# Patient Record
Sex: Male | Born: 1970 | Race: White | Hispanic: No | Marital: Married | Smoking: Current every day smoker
Health system: Southern US, Community
[De-identification: ages and names within clinical notes are randomized; demographics above are authoritative.]

---

## 2000-03-22 ENCOUNTER — Inpatient Hospital Stay (HOSPITAL_COMMUNITY): Admission: EM | Admit: 2000-03-22 | Discharge: 2000-03-26 | Payer: Self-pay | Admitting: *Deleted

## 2000-03-22 ENCOUNTER — Encounter: Payer: Self-pay | Admitting: Emergency Medicine

## 2000-03-23 ENCOUNTER — Encounter: Payer: Self-pay | Admitting: Internal Medicine

## 2000-03-25 ENCOUNTER — Encounter: Payer: Self-pay | Admitting: Gastroenterology

## 2003-11-29 ENCOUNTER — Inpatient Hospital Stay (HOSPITAL_BASED_OUTPATIENT_CLINIC_OR_DEPARTMENT_OTHER): Admission: RE | Admit: 2003-11-29 | Discharge: 2003-11-29 | Payer: Self-pay | Admitting: Cardiology

## 2006-06-03 ENCOUNTER — Ambulatory Visit (HOSPITAL_COMMUNITY): Admission: RE | Admit: 2006-06-03 | Discharge: 2006-06-04 | Payer: Self-pay | Admitting: Neurosurgery

## 2007-03-27 ENCOUNTER — Inpatient Hospital Stay (HOSPITAL_COMMUNITY): Admission: RE | Admit: 2007-03-27 | Discharge: 2007-03-30 | Payer: Self-pay | Admitting: Neurosurgery

## 2007-10-02 ENCOUNTER — Emergency Department (HOSPITAL_COMMUNITY): Admission: EM | Admit: 2007-10-02 | Discharge: 2007-10-02 | Payer: Self-pay | Admitting: Emergency Medicine

## 2007-11-04 ENCOUNTER — Ambulatory Visit (HOSPITAL_COMMUNITY): Admission: RE | Admit: 2007-11-04 | Discharge: 2007-11-04 | Payer: Self-pay | Admitting: Neurosurgery

## 2008-09-30 IMAGING — CR DG LUMBAR SPINE 2-3V
1 series · 1 of 1 positions shown · non-contrast
Comparison: none

CLINICAL DATA: 36-year-old with lumbar HNP.  Myelopathy.  Posterior fusion L5-S1. 
 LUMBAR SPINE ? 2 VIEW:

[view not recorded]
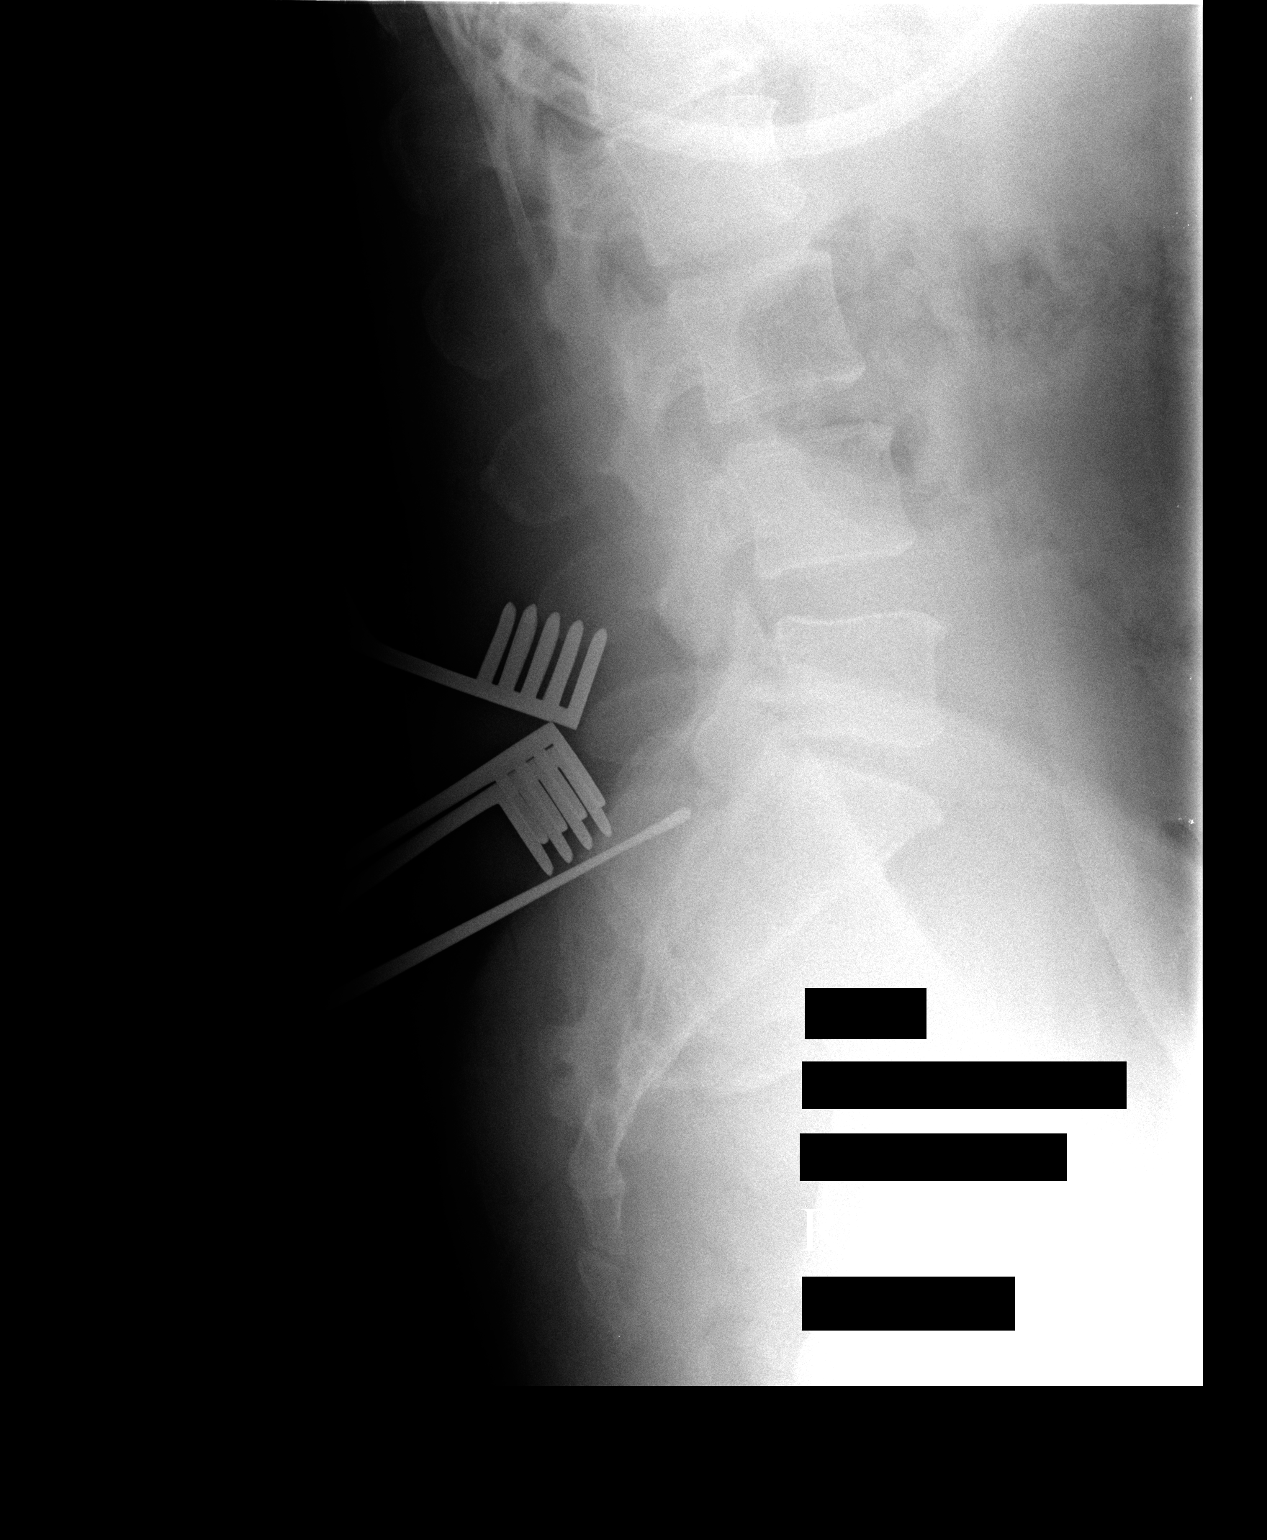

[1 of 1 positions shown; findings below may reference images not displayed]

FINDINGS: Exam at [DATE] hours shows posterior retractor in place and a surgical instrument overlying the posterior elements of the upper sacrum.  Film at [DATE] hours shows posterior retractor in place and a surgical instrument overlying the posterior elements posterior to the L5-S1 level.
IMPRESSION: Intraoperative localization.

## 2008-11-11 ENCOUNTER — Encounter: Admission: RE | Admit: 2008-11-11 | Discharge: 2008-11-11 | Payer: Self-pay | Admitting: Neurosurgery

## 2009-01-14 ENCOUNTER — Encounter
Admission: RE | Admit: 2009-01-14 | Discharge: 2009-01-20 | Payer: Self-pay | Admitting: Physical Medicine & Rehabilitation

## 2009-01-18 ENCOUNTER — Ambulatory Visit: Payer: Self-pay | Admitting: Physical Medicine & Rehabilitation

## 2009-01-26 ENCOUNTER — Emergency Department (HOSPITAL_COMMUNITY): Admission: EM | Admit: 2009-01-26 | Discharge: 2009-01-26 | Payer: Self-pay | Admitting: Emergency Medicine

## 2009-04-03 ENCOUNTER — Ambulatory Visit: Admission: RE | Admit: 2009-04-03 | Discharge: 2009-04-03 | Payer: Self-pay | Admitting: Neurology

## 2009-05-10 IMAGING — RF DG MYELOGRAM LUMBAR
11 series · 11 of 11 positions shown · non-contrast
Comparison: No comparison postsurgical myelogram or CT.
Intraoperative C-arm views 03/27/2007.

CLINICAL DATA: Low back pain.

MYELOGRAM LUMBAR
TECHNIQUE: Contrast was instilled by Dr. Dr. Gineth at the L3-4
level..
TECHNIQUE: CT imaging of the lumbar spine was performed after
intrathecal contrast administration.  Multiplanar CT image
reconstructions were also generated.

[Series 1: run · 1 of 1 slices shown (1 of 11)]
[im 1/1]
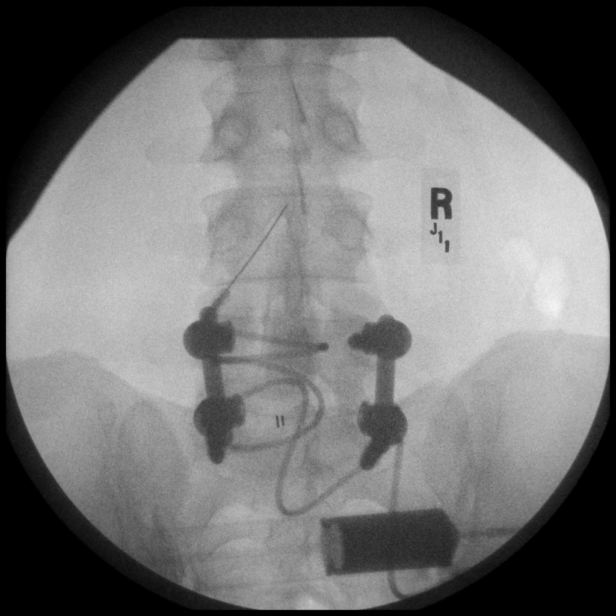

[Series 2: run · 1 of 1 slices shown (2 of 11)]
[im 1/1]
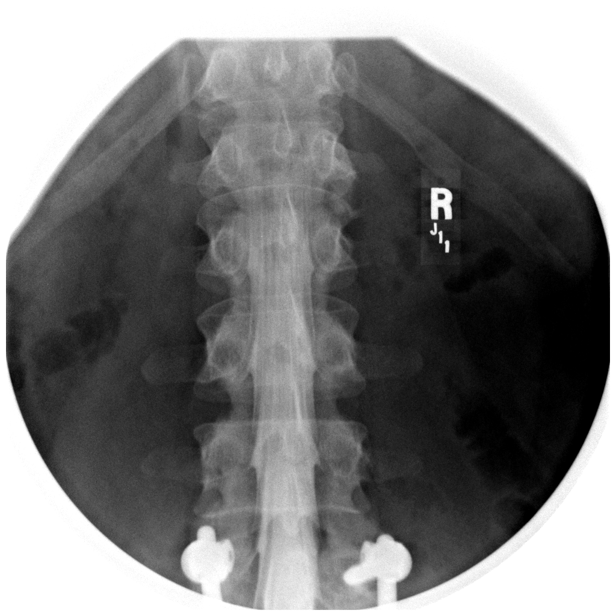

[Series 3: run · 1 of 1 slices shown (3 of 11)]
[im 1/1]
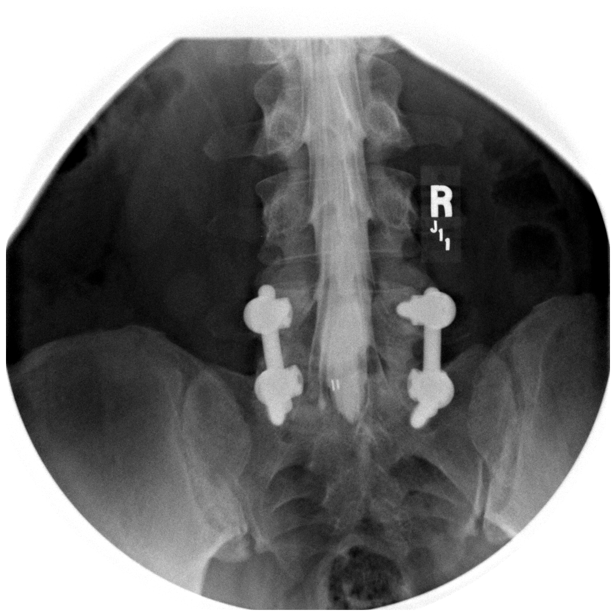

[Series 4: run · 1 of 1 slices shown (4 of 11)]
[im 1/1]
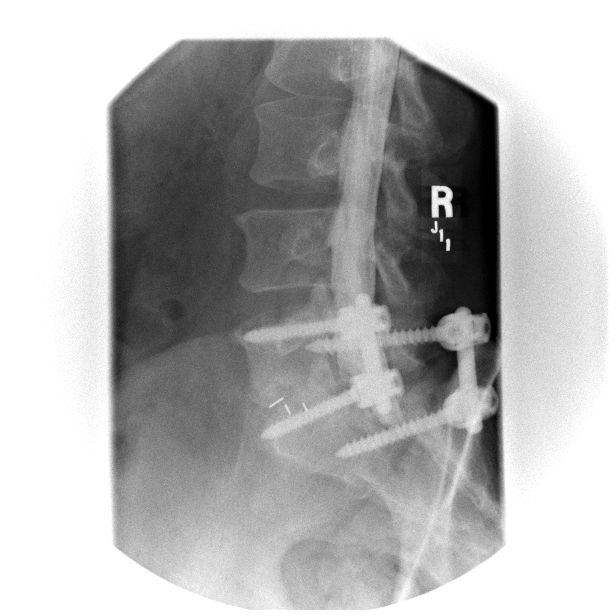

[Series 5: run · 1 of 1 slices shown (5 of 11)]
[im 1/1]
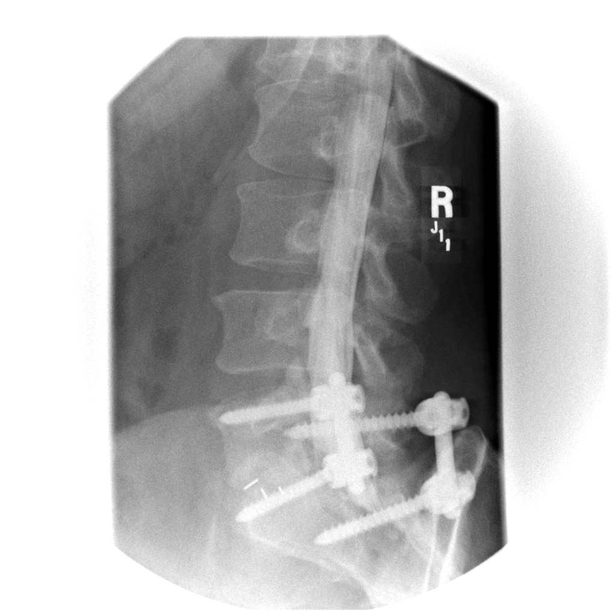

[Series 6: run · 1 of 1 slices shown (6 of 11)]
[im 1/1]
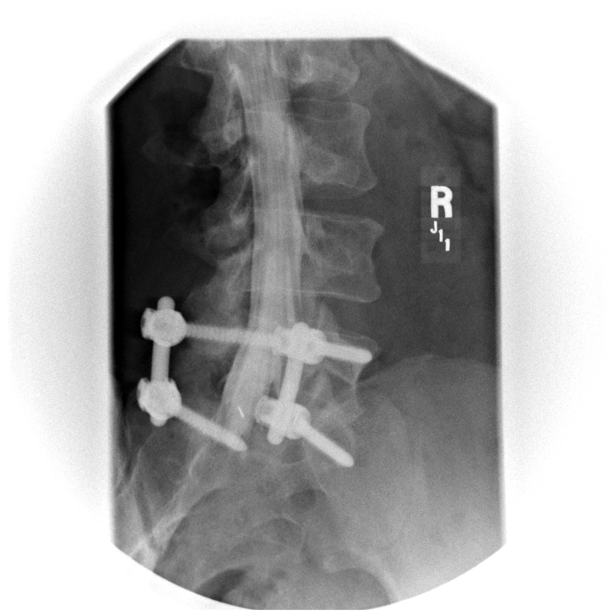

[Series 7: run · 1 of 1 slices shown (7 of 11)]
[im 1/1]
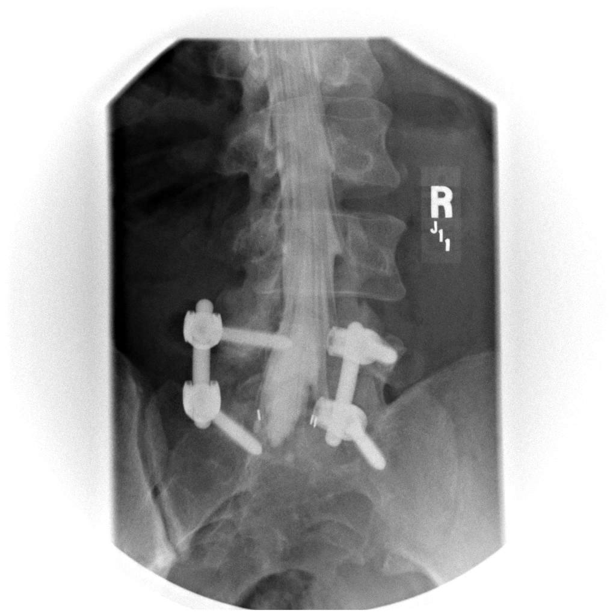

[Series 8: run · 1 of 1 slices shown (8 of 11)]
[im 1/1]
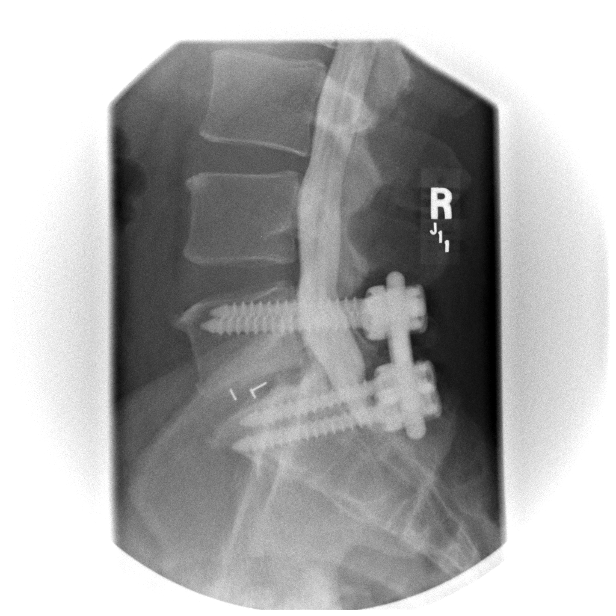

[Series 9: run · 1 of 1 slices shown (9 of 11)]
[im 1/1]
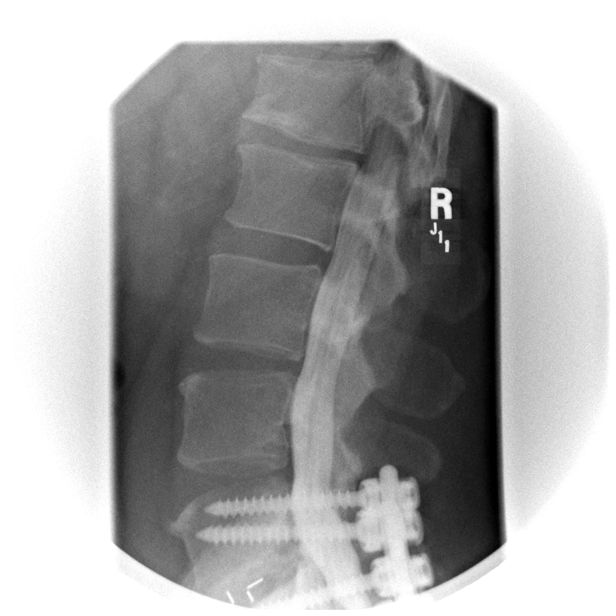

[Series 10: run · 1 of 1 slices shown (10 of 11)]
[im 1/1]
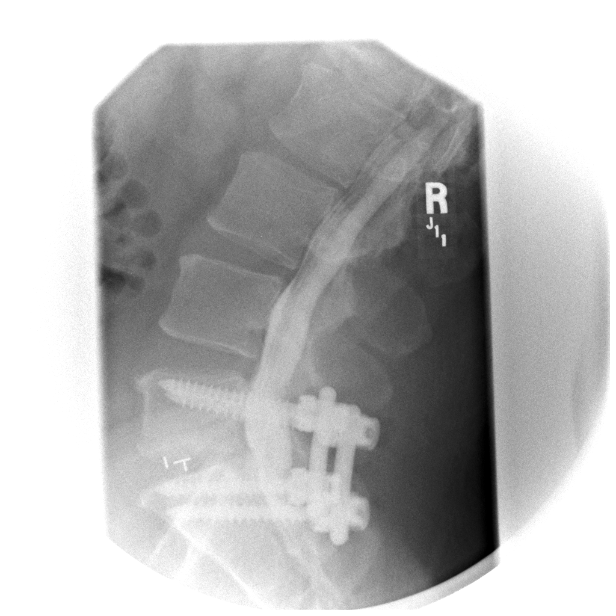

[Series 11: run · 1 of 1 slices shown (11 of 11)]
[im 1/1]
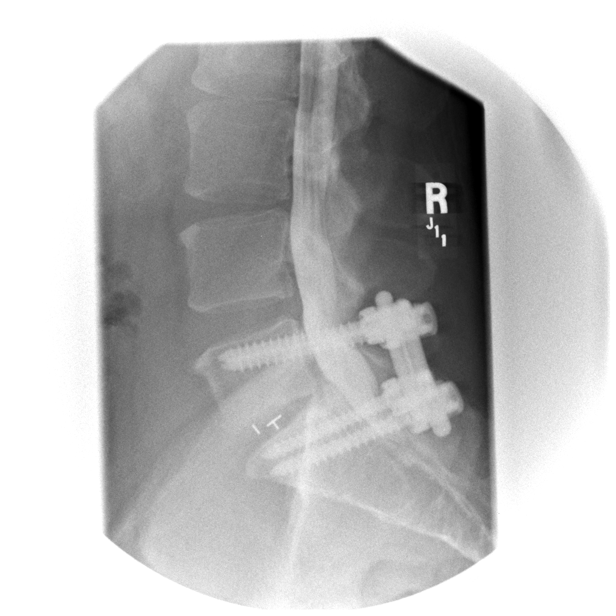

[11 of 11 positions shown; findings below may reference images not displayed]

FINDINGS: Status post fusion L5-S1 level.  No specific nerve root
compression.  Mild ventral impression upon the thecal sac at the L1-
2 and level.  No abnormal motion between flexion and extension.
IMPRESSION: Status post fusion L5-S1.

Mild ventral impression on the thecal sac L1-2 level.

CT MYELOGRAPHY LUMBAR SPINE
FINDINGS: Present examination incorporates from T12-L1 through the
upper sacrum. Conus mid L1 level.

T12-L1:  No significant spinal stenosis.

L1-2: Moderate Schmorl's node deformity.  Mild to moderate bulge.
Mild spinal stenosis.

L2-3: No significant spinal stenosis or foraminal narrowing.

L3-4: No significant spinal stenosis or foraminal narrowing.

L4-5: Minimal bulge.  Mild bilateral facet joint degenerative
changes.  No significant spinal stenosis or foraminal narrowing.

L5-S1: Prior fusion.  Bilateral pedicle screws and posterior bar in
place.  Interbody fusion device in place without well defined bony
fusion.  Broad-based disc osteophyte complex. Centrally at this
does not cause significant thecal sac or nerve root compromise..
This contributes to bilateral foraminal narrowing, left greater
right without significant nerve root compression..  Status post
left hemilaminectomy.  Postoperative granulation tissue surrounds
the proximal left S1 nerve root but does not cause significant
narrowing of such.  This postop granulation tissue causes minimal
flattening of the left lateral ventral aspect of thecal sac just
above the takeoff of the left S2 nerve root.
IMPRESSION: Status post fusion L5-S1 level without significant bony
incorporation across the interbody fusion device.  Disc osteophyte
complex contributes to bilateral foraminal narrowing without
significant compression of the exiting L5 nerve roots.
Postoperative granulation tissue surrounds the left S1 nerve root
(without significant compression of such) with minimal flattening
of the adjacent thecal sac.

L1-2 mild to  moderate bulge with mild spinal stenosis.

## 2009-05-10 IMAGING — CT CT L SPINE W/ CM
4 of 11 series · 9 of 33 positions shown, 10 images · non-contrast
Comparison: No comparison postsurgical myelogram or CT.
Intraoperative C-arm views 03/27/2007.

CLINICAL DATA: Low back pain.

MYELOGRAM LUMBAR
TECHNIQUE: Contrast was instilled by Dr. Dr. Gineth at the L3-4
level..
TECHNIQUE: CT imaging of the lumbar spine was performed after
intrathecal contrast administration.  Multiplanar CT image
reconstructions were also generated.

[Series 2: l-spine helical · axial · 0.27mm/px · z∈[-312,-207]mm · 3 of 84 slices shown, 4 images]
[im 21/84  soft-tissue]
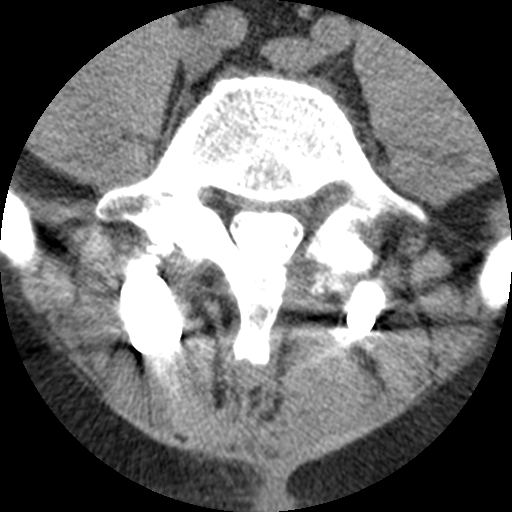
[im 21/84  bone]
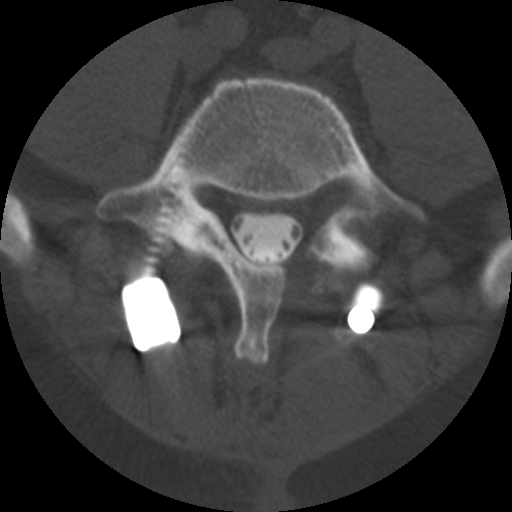
[im 42/84  bone]
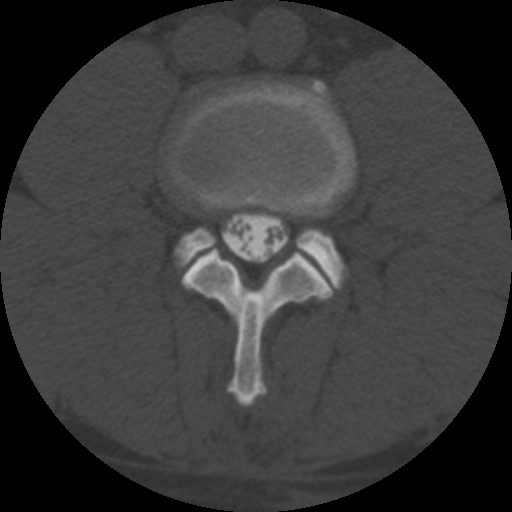
[im 63/84  bone]
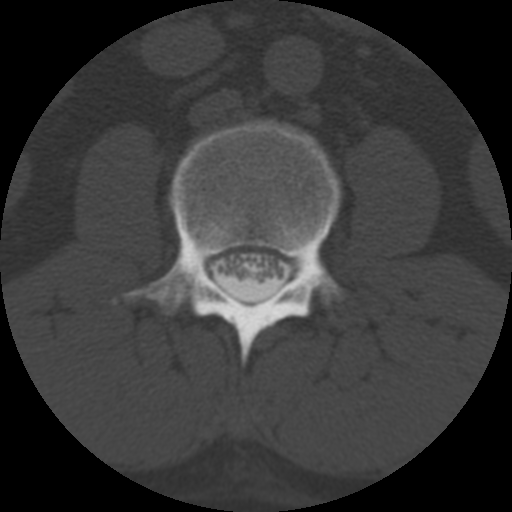

[Series 3: recon 2: l-spine helical · axial · 0.27mm/px · z∈[-294,-224]mm · 2 of 84 slices shown]
[im 28/84  bone]
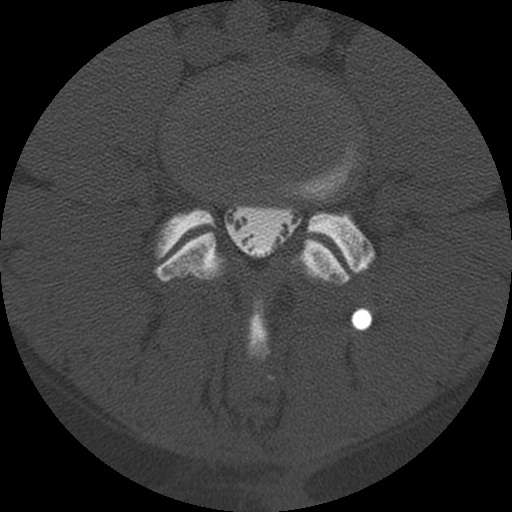
[im 56/84  bone]
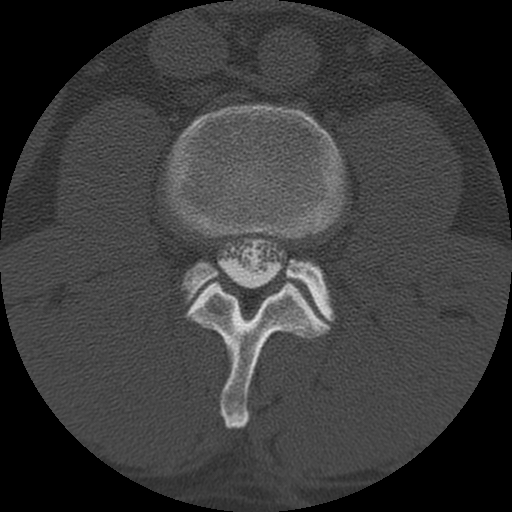

[Series 401: reformatted · coronal · 0.42mm/px · 1 of 44 slices shown (1 of 2)]
[im 22/44  bone]
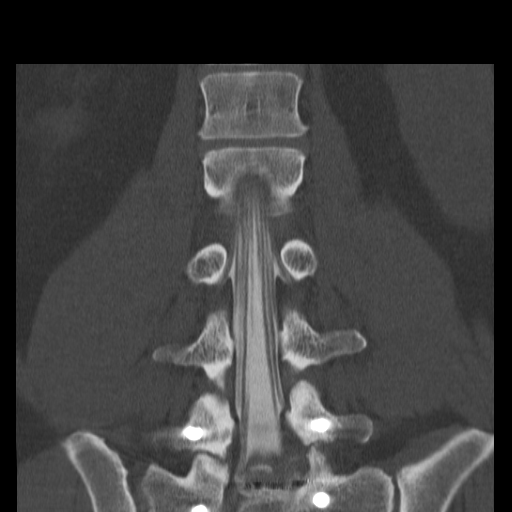

[Series 402: reformatted · coronal · 0.42mm/px · 3 of 50 slices shown (2 of 2)]
[im 17/50  bone]
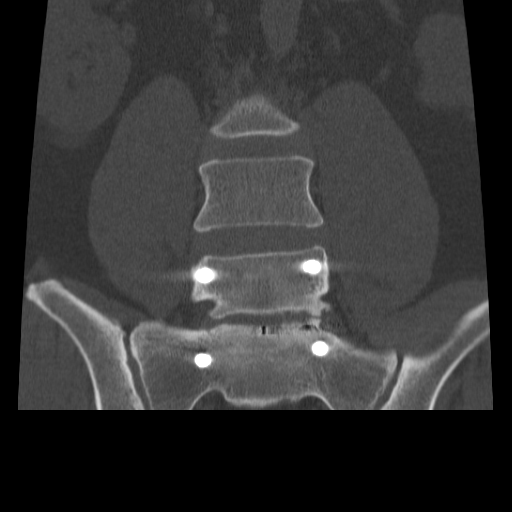
[im 33/50  bone]
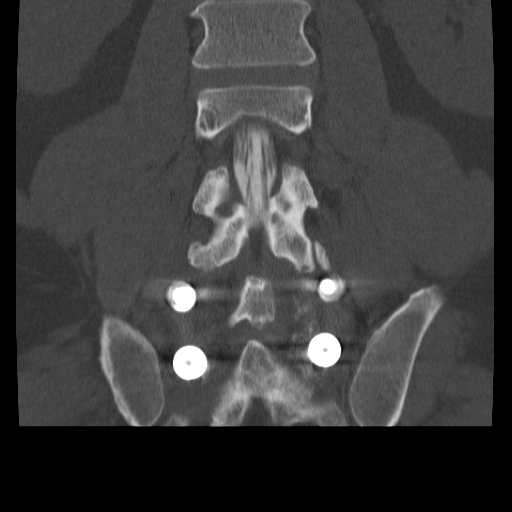
[im 49/50  bone]
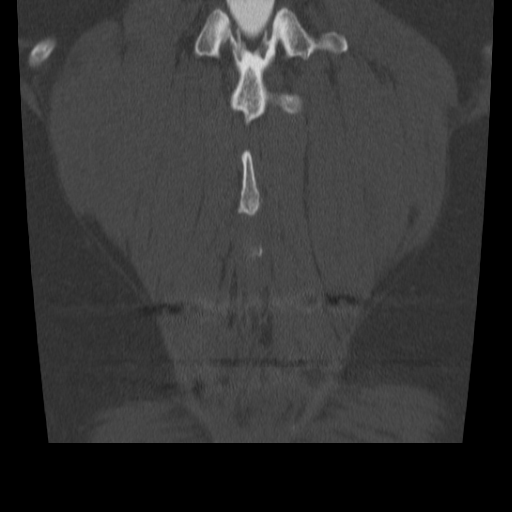

[9 of 33 positions shown; findings below may reference images not displayed]

FINDINGS: Status post fusion L5-S1 level.  No specific nerve root
compression.  Mild ventral impression upon the thecal sac at the L1-
2 and level.  No abnormal motion between flexion and extension.
IMPRESSION: Status post fusion L5-S1.

Mild ventral impression on the thecal sac L1-2 level.

CT MYELOGRAPHY LUMBAR SPINE
FINDINGS: Present examination incorporates from T12-L1 through the
upper sacrum. Conus mid L1 level.

T12-L1:  No significant spinal stenosis.

L1-2: Moderate Schmorl's node deformity.  Mild to moderate bulge.
Mild spinal stenosis.

L2-3: No significant spinal stenosis or foraminal narrowing.

L3-4: No significant spinal stenosis or foraminal narrowing.

L4-5: Minimal bulge.  Mild bilateral facet joint degenerative
changes.  No significant spinal stenosis or foraminal narrowing.

L5-S1: Prior fusion.  Bilateral pedicle screws and posterior bar in
place.  Interbody fusion device in place without well defined bony
fusion.  Broad-based disc osteophyte complex. Centrally at this
does not cause significant thecal sac or nerve root compromise..
This contributes to bilateral foraminal narrowing, left greater
right without significant nerve root compression..  Status post
left hemilaminectomy.  Postoperative granulation tissue surrounds
the proximal left S1 nerve root but does not cause significant
narrowing of such.  This postop granulation tissue causes minimal
flattening of the left lateral ventral aspect of thecal sac just
above the takeoff of the left S2 nerve root.
IMPRESSION: Status post fusion L5-S1 level without significant bony
incorporation across the interbody fusion device.  Disc osteophyte
complex contributes to bilateral foraminal narrowing without
significant compression of the exiting L5 nerve roots.
Postoperative granulation tissue surrounds the left S1 nerve root
(without significant compression of such) with minimal flattening
of the adjacent thecal sac.

L1-2 mild to  moderate bulge with mild spinal stenosis.

## 2010-01-03 ENCOUNTER — Ambulatory Visit: Payer: Self-pay | Admitting: Gastroenterology

## 2010-01-03 DIAGNOSIS — K219 Gastro-esophageal reflux disease without esophagitis: Secondary | ICD-10-CM | POA: Insufficient documentation

## 2010-01-03 DIAGNOSIS — K589 Irritable bowel syndrome without diarrhea: Secondary | ICD-10-CM | POA: Insufficient documentation

## 2010-01-04 ENCOUNTER — Encounter: Payer: Self-pay | Admitting: Gastroenterology

## 2010-02-06 LAB — CONVERTED CEMR LAB
AST: 32 units/L (ref 0–37)
Albumin: 4.6 g/dL (ref 3.5–5.2)
BUN: 12 mg/dL (ref 6–23)
Basophils Absolute: 0.1 10*3/uL (ref 0.0–0.1)
CO2: 20 meq/L (ref 19–32)
Calcium: 9.4 mg/dL (ref 8.4–10.5)
Eosinophils Absolute: 0.3 10*3/uL (ref 0.0–0.7)
Glucose, Bld: 112 mg/dL — ABNORMAL HIGH (ref 70–99)
Lymphocytes Relative: 30 % (ref 12–46)
Lymphs Abs: 2.3 10*3/uL (ref 0.7–4.0)
MCHC: 34.5 g/dL (ref 30.0–36.0)
MCV: 92.9 fL (ref 78.0–100.0)
Monocytes Absolute: 0.5 10*3/uL (ref 0.1–1.0)
Monocytes Relative: 7 % (ref 3–12)
Neutro Abs: 4.4 10*3/uL (ref 1.7–7.7)
RDW: 13.7 % (ref 11.5–15.5)
Sodium: 137 meq/L (ref 135–145)
Total Protein: 7.1 g/dL (ref 6.0–8.3)

## 2010-03-23 ENCOUNTER — Encounter (INDEPENDENT_AMBULATORY_CARE_PROVIDER_SITE_OTHER): Payer: Self-pay | Admitting: *Deleted

## 2010-06-11 ENCOUNTER — Encounter: Payer: Self-pay | Admitting: Neurosurgery

## 2010-06-20 NOTE — Miscellaneous (Signed)
Summary: Orders Update  Clinical Lists Changes  Orders: Added new Test order of T-CBC w/Diff ([id number]) - Signed Added new Test order of T-Comprehensive Metabolic Panel ([id number]) - Signed 

## 2010-06-20 NOTE — Letter (Signed)
Summary: Recall Office Visit  [facility]  [address]   [place], [place] [address]   Phone: [phone]  Fax: [phone]      March 23, 2010   Dale Farmer [address] [place], [place]  [address] 06-01-1970   Dear Mr. Linzy,   According to our records, it is time for you to schedule a follow-up office visit with [place].   At your convenience, please call [phone] to schedule an office visit. If you have any questions, concerns, or feel that this letter is in error, we would appreciate your call.   Sincerely,    Diana Eves  [facility] Ph: [phone]   Fax: [phone]

## 2010-06-20 NOTE — Miscellaneous (Signed)
Summary: Orders Update  Clinical Lists Changes  Orders: Added new Test order of T-Stool Giardia / Crypto- EIA (91478) - Signed Added new Test order of T-Fecal WBC ([id number]) - Signed Added new Test order of T-Culture, C-Diff Toxin A/B ([id number]) - Signed Added new Test order of T-Culture, C-Diff Toxin A/B ([id number]) - Signed Added new Test order of T-Culture, C-Diff Toxin A/B ([id number]) - Signed

## 2010-06-20 NOTE — Assessment & Plan Note (Signed)
Summary: loose stools, RECTAL BLEEDING, GERD   Visit Type:  Initial Consult Referring Provider:  Tapper Primary Care Provider:  Margo Common, M.D.  Chief Complaint:  constipation/rectal bleeding.  History of Present Illness: 2009: felt like whole GI tract shut down. Passing blood and comes and goes. None for last 7 days. Pain in rectum from having to push stool out. NUR removed a polyp one time. May have bloating and pain. reflux Sx: eats the wrong thing. Can get choked when eating. Just solid feel like it won't go down. 3-4x/month. No meds for reflux. Used to take Nexium years. Get relief from vomiting. Last EGD-black stuff in emesis. Milk: 1-2 times a week. Rare ice cream. Cheese: no. GB in. No Ibuprofen, Motrin, or ALeve. Uses Alka Selzter. On Relafen two times a day for 3 mos. Lost 20 lbs. Loose stool for 3 weeks: BM-3-4 times a day. No travel. Last Abx-> 1 year. No fresh water exposure.   Preventive Screening-Counseling & Management  Alcohol-Tobacco     Smoking Status: current      Drug Use:  no.    Current Medications (verified): 1)  Levothroid 200 Mcg Tabs (Levothyroxine Sodium) .... Take 1 Tablet By Mouth Once A Day 2)  Nucynta 100 Mg Tabs (Tapentadol Hcl) .... As Needed 3)  Atenolol 50 Mg Tabs (Atenolol) .... Take 1 Tablet By Mouth Once A Day 4)  Robaxin-750 750 Mg Tabs (Methocarbamol) .... Take 1 Tablet By Mouth Two Times A Day 5)  Impramine 25mg  .... Take 3 Tablets By Mouth At Bedtime 6)  Clonazepam 2 Mg Tabs (Clonazepam) .... One Tablet Daily 7)  Relafen 750 .... One Tablet Daily  Allergies (verified): No Known Drug Allergies  Past History:  Past Medical History: Chronic back and leg pain Hypertension Hypothyroidism SLEEP APNEA-CPAP SYSTEMS ON 10 AND BRINGING HIM UP TO 11 DEPRESSION  Past Surgical History: BACK SURGERY HEART CATH: 10 YEARS AGO, no blockages  Family History: FH of Colon Cancer: grandmother, and aunt No IBD  Social History: Occupation: disabled x4  years Married: 12 years, 4 kids Patient currently smokes: 3-4 cigs a day Alcohol Use - no Illicit Drug Use - no Smoking Status:  current Drug Use:  no  Review of Systems       Per HPI otherwise all systems negative.  Vital Signs:  Patient profile:   40 year old male Height:      66 inches Weight:      253 pounds BMI:     40.98 Temp:     98.5 degrees F oral Pulse rate:   80 / minute BP sitting:   120 / 92  (left arm) Cuff size:   large  Vitals Entered By: Cloria Spring LPN (January 03, 2010 3:47 PM)  Physical Exam  General:  Well developed, well nourished, no acute distress. Head:  Normocephalic and atraumatic. Eyes:  PERRLA, no icterus. Mouth:  No deformity or lesions. Neck:  Supple; no masses. Lungs:  Clear throughout to auscultation. Heart:  Regular rate and rhythm; no murmurs. Abdomen:  Soft, nontender and nondistended. No masses noted. Normal bowel sounds. obese.   Extremities:  No edema or deformities noted. Neurologic:  Alert and  oriented x4;  grossly normal neurologically. Skin:  No pre-tibial lesions Psych:  Alert and cooperative. Depressed affect and mood.    Impression & Recommendations:  Problem # 1:  IRRITABLE BOWEL SYNDROME (ICD-564.1) Assessment Unchanged DIARRHEA PREDOMINANT w/ rectal urgency. Differential diagnosis includes lactose intolerace. Avoid dairy products. SEE HANDOUT. SUBMIT  STOOL STUDIES and get labs drawn. Add dicyclomine 30 minutes before meals two times a day. May cause drowsiness, dry mouth/eyes,  and constipation.  Problem # 2:  GERD (ICD-530.81) Uncontrolled on no meds CAUSING INTERMITTENT DYPSHAGIA. Multiple lifestyle risk factors contributing to GERD. ADD NEXIUM 30 minutes before meals two times a day. FOLLOW GERD HO RECOMMENDATIONS. Follow up in 3 mos. Reassess dysphagia AND NEED FOR EGD.  cc:PCP and Dr. [name]  Patient Instructions: 1)  Avoid dairy products. SEE HANDOUT. 2)  SUBMIT STOOL STUDIES and get labs drawn. 3)  Add  dicyclomine 30 minutes before meals two times a day. 4)  May cause drowsiness, dry mouth/eyes,  and constipation. 5)  ADD NEXIUM 30 minutes before meals two times a day. 6)  FOLLOW GERD HO RECOMMENDATIONS. 7)  Follow up in 3 mos. 8)  The medication list was reviewed and reconciled.  All changed / newly prescribed medications were explained.  A complete medication list was provided to the patient / caregiver. Prescriptions: BENTYL 10 MG CAPS (DICYCLOMINE HCL) 1 by mouth bid  #60 x 5   Entered and Authorized by:   West Bali MD   Signed by:   West Bali MD on 01/03/2010   Method used:   Electronically to        [facility] [address] (retail)       [address]       [place], [place]  [address]       Ph: [phone] or [phone]       Fax: [phone]   RxID:   [id number] NEXIUM 40 MG CPDR (ESOMEPRAZOLE MAGNESIUM) 1 by mouth 30 minutes prior to meals two times a day.  #60 x 5   Entered and Authorized by:   West Bali MD   Signed by:   West Bali MD on 01/03/2010   Method used:   Electronically to        [facility] [address] (retail)       [address]       [place], [place]  [address]       Ph: [phone] or [phone]       Fax: [phone]   RxID:   [id number]   Appended Document: loose stools, RECTAL BLEEDING, GERD SAW NUR 2009-GERD, rectal bleeding, RECTAL URGENCY, DIARRHEA 3 STOOLS/DAY, off & on, 3-4 colonoscopies, and an EGD. 2009: hospitalized CTAP; MILD COLON DISTENSTION, fatty liver disease, abd u/s-HM, SM, celiac panel negative. 254 lbs, FAILED H2B, & PREVACID. Rx: Protonix. TCS: 3 MM, SIMPLE ADENOMA, NL COLON BIOPSIES.  Appended Document: loose stools, RECTAL BLEEDING, GERD REMINDER OF 3 MONTH OPV IS IN THE COMPUTER/LAW  Appended Document: loose stools, RECTAL BLEEDING, GERD 3 MONTH F/U OV IS IN THE COMPUTER/LAW  Appended Document: Orders Update    Clinical Lists  Changes  Orders: Added new Service order of Consultation Level V [phone]) - Signed

## 2010-10-03 NOTE — Op Note (Signed)
NAME:  Dale Farmer, Dale Farmer NO.:  [id number]   MEDICAL RECORD NO.:  [id number]          PATIENT TYPE:  INP   LOCATION:  3029                         FACILITY:  [facility]   PHYSICIAN:  Cristi Loron, M.D.DATE OF BIRTH:  04-14-1971   DATE OF PROCEDURE:  03/27/2007  DATE OF DISCHARGE:  03/30/2007                               OPERATIVE REPORT   BRIEF HISTORY:  The patient is a 40 year old white male who previously  has suffered a L5-S1 herniated disc on the left.  I performed a left L5-  S1 microdiscectomy on him in January 2008.  The patient initially did  very well with surgery but recently has developed recurrent back and leg  pain consistent with a left S1 radiculopathy.  He failed medical  management, was worked up with an lumbar MRI which demonstrated  recurrent herniated disc at L5-S1 on the left.  I discussed various  treatments with the patient including surgery.  The patient has weighed  the risks, benefits, and alternatives of surgery and decided to proceed  with an L5-S1 decompression and fusion.   PREOPERATIVE DIAGNOSES:  L5-S1 recurrent herniated nucleus pulposus,  spinal stenosis, degenerative disease, lumbar radiculopathy, and  lumbago.   POSTOPERATIVE DIAGNOSES:  L5-S1 recurrent herniated nucleus pulposus,  spinal stenosis, degenerative disease, lumbar radiculopathy, and  lumbago.   PROCEDURE:  Redo left L5-S1 microdiskectomy using microdissection; L5-S1  transforaminal lumbar interbody fusion; insertion of L5-S1 interbody  prosthesis (Capstone PEEK); L5-S1 posterior nonsegmental instrumentation  with Legacy titanium pedicle screws and rods; L5-S1 posterolateral  arthrodesis with local autograft bone and VITOSS bone-graft extender.   SURGEON:  Cristi Loron, M.D.   ASSISTANT:  Hilda Lias, M.D.   ANESTHESIA:  General endotracheal.   ESTIMATED BLOOD LOSS:  400 mL.   SPECIMENS:  None.   DRAINS:  Medium Hemovac.   COMPLICATIONS:   None.   DESCRIPTION OF PROCEDURE:  The patient was brought to the operating room  by the anesthesia team.  General endotracheal anesthesia was induced.  The patient was then turned to the prone position on the Wilson frame.  His lumbosacral region was then prepared with Betadine scrub and  Betadine solution.  Sterile drapes were applied.  I then injected the  area to be incised with Marcaine with epinephrine solution.  I used a  scalpel to make a linear midline incision over L5-S1 interspace through  the patient's preexisting surgical scar.  I used a electrocautery to  perform a bilateral subperiosteal dissection exposing the spinous  process lamina of L4-L5 and upper sacrum.  We then obtained  intraoperative radiograph to confirm our location and then inserted the  Versa-Trac retractor for exposure.  We began by drilling away some of  the epidural fibrosis at L5-S1 and widening the prior left L5  laminotomy.  I used the Kerrison punch and high-speed drill to remove  the inferior facet at L5, and this gave exposure to relatively scarred  dura.  I then performed a foraminotomy about the left S1 nerve root.  This gave [place] lateral exposure to the intervertebral disc.  We  encountered expected  amount of epidural fibrosis and carefully dissected  through the scar tissue to expose the intervertebral disc.  We then  incised the L5-S1 intervertebral disc with a #15 blade scalpel and  performed a partial intervertebral discectomy using the pituitary  forceps and the Carlen and Scoville curettes.  We removed the disc  herniation from the disc which was compressing the left S1 nerve root  completing the decompression.   We now turned attention to the arthrodesis.  We prepared the vertebral  endplates for interbody fusion by using the curettes to remove the soft  tissues.  We then used trial spacers and determined to use a 14 mm x 26  mm PEEK interbody prosthesis.  We prefilled this prosthesis with  a  combination of VITOSS and local autograft bone and then filled the  anterior end of disc space with VITOSS and autograft bone.  We inserted  the prosthesis in the interspace, of course, after retracting the neural  structures out of harm's way.  We then turned the prosthesis sideways  using bone tamps and then filled the posterior end of disc space with  VITOSS and autograft bone completing the transforaminal lumbar interbody  fusion.   We now turned attention to the instrumentation.  Under fluoroscopic  guidance, we cannulated the bilateral L5-S1 pedicles with the bone  probes.  We tapped the pedicles with 5.5 mm tap and then inserted a 6.5-  mm screws into the L5 and S1 pedicles bilaterally under fluoroscopic  guidance.  We palpated along the medial aspect of the L5 and S1 pedicles  and noted there were no cortical breeches and the L5 and S1 nerve roots  were not injured on the left.  We then connected unilateral pedicle  screws with lordotic rod which we tightened them in place and then  placed the construct in slight lordosis using compression.  We then  secured the rods and screws by placing the caps which we tightened  appropriately completing the instrumentation.   We now turned attention to posterolateral arthrodesis.  Used high-speed  drill to decorticate the remainder of the L5 and S1 facet pars region  and L5 transverse processes.  We then laid combination of local  autograft bone and VITOSS bone-graft extender over these decorticated  posterolateral structures completing the posterolateral arthrodesis.   We then obtained hemostasis using bipolar cautery, irrigated the wound  out with bacitracin solution, inspected the left thecal sac and left L5-  S1 nerve roots and noted they were well decompressed.  We then removed  the retractor and then reapproximated the patient's thoracolumbar fascia  with interrupted #1 Vicryl suture, subcutaneous tissue with interrupted  2-0  Vicryl suture, and skin with Steri-Strips and Benzoin.  The wound  was then coated with bacitracin ointment.  A sterile dressing was  applied.  The drapes were removed.  The patient was subsequently  returned to supine position where he was extubated by the anesthesia  team and transported to the post-anesthesia care unit in a stable  condition.  All sponge, instrument, and needle counts were correct at  the end of this case.      Cristi Loron, M.D.  Electronically Signed     JDJ/MEDQ  D:  03/30/2007  T:  03/31/2007  Job:  [id number]

## 2010-10-03 NOTE — Discharge Summary (Signed)
NAME:  Dale Farmer, Dale Farmer NO.:  [id number]   MEDICAL RECORD NO.:  [id number]          PATIENT TYPE:  INP   LOCATION:  3029                         FACILITY:  [facility]   PHYSICIAN:  Stefani Dama, M.D.  DATE OF BIRTH:  December 19, 1970   DATE OF ADMISSION:  03/27/2007  DATE OF DISCHARGE:  03/30/2007                               DISCHARGE SUMMARY   ADMISSION DIAGNOSIS:  Recurrent herniated nucleus pulposus, L5-S1, with  spondylosis.   DISCHARGE AND FINAL DIAGNOSIS:  Recurrent herniated nucleus pulposus, L5-  S1, with lumbar radiculopathy, degenerate disk disease, and spondylosis  of L5-S1.   CONDITION ON DISCHARGE:  Improved.   HOSPITAL COURSE:  Mr. [name] is a 40 year old individual who was  had significant back and lower extremity pain.  He was treated  conservatively and was found to have recurrent disk herniation at the L5-  S1 level.  Having failed efforts at conservative management, he was  advised that surgical intervention should require not only decompression  but arthrodesis of the lumbar spine.  This was performed on March 27, 2007.  Postoperatively, the patient has been ambulated with some help of  physical therapy.  His incision is clean and dry.  Drain that was placed  at the time of surgery was removed on March 29, 2007.  He has  tolerated activities since having the drain removed, and at this time he  is discharged home with a prescription for Percocet 10/325, #100; and  Valium 5 mg, #50.  He has been written for a rolling walker, 3-in-1  chair, and home health physical therapy to evaluate him   CONDITION ON DISCHARGE:  Stable.      Stefani Dama, M.D.  Electronically Signed     HJE/MEDQ  D:  03/30/2007  T:  03/31/2007  Job:  [id number]

## 2010-10-03 NOTE — Group Therapy Note (Signed)
HISTORY OF PRESENT ILLNESS:  A 40 year old male referred for further  evaluation of low back pain and lower extremity pain.  He has had no  recent trauma.  His pain is chronic in nature.  He was seen initially by  Dr. Lovell Sheehan in 2007.  Workup including MRI demonstrated L5-S1 left-sided  herniated disk, compression of left L5 and S1 nerve root, he underwent  L5-S1 microdiskectomy, treated with Lortab.  He initially did well and  then in about 6 months later developed more pain.  MRI showed recurrent  L5-S1 HNP.  He underwent a redo left L5-S1 microdiskectomy, as well as a  fusion treated with Percocet preoperatively.  He was also trialed on  Neurontin postoperatively.  He had a fall postoperatively, but x-rays,  as well as C-spine CT myelogram demonstrated no new problems with  instrumentation intact, although with no significant bony incorporation  against the inner fusion device.  Postop granulation tissue at the left  S1 nerve root was noted.  One year postop really did not get much  better, tried therapy again, which the patient reports did not help,  although I do not have any therapy notes to know what type of therapy  was done.  He is pursuing disability.  He has been in pursuance of this  for a year and half.  He has not explored any other occasional  rehabilitation options.  His Oswestry score is 76%, states that it help  with bathing and toileting and dressing, although more specifically he  just needs to wear slip-on shoes per his report.  His left-sided leg  pain seems to be about the worst of his pain.   His opioid risk tool showed him to be at a low risk of opioid abuse with  1 score.   He sees a primary care physician, Dr. Margo Common, as well as Dr. Lovell Sheehan.   CURRENT MEDICATIONS:  Endocet 10/325, he gets 100 per month, but states  that he takes about 5 times per day, levothyroxine 200 mg per day,  diclofenac 75 b.i.d., baclofen 10 mg t.i.d.  In case the baclofen has  helped  the left lower extremity pain to certain degree.   PAST MEDICAL HISTORY:  Significant for chest pain, but had a negative  cardiac cath in 2005, states that he has high blood pressure, but is not  being treated for this.   SOCIAL HISTORY:  Married, lives with his wife.   FAMILY HISTORY:  Heart disease, lung disease, high blood pressure.   PHYSICAL EXAMINATION:  VITAL SIGNS:  Blood pressure 162/92, pulse 105,  respirations 18, O2 sat 98% on room air.  GENERAL:  Well-developed, well-nourished overweight male in no acute  distress.  EXTREMITIES:  Without edema, orientation x3.  Affect alert and  irritable.  Gait is normal.  Coordination normal in the upper and lower  extremity.  Deep tendon reflexes are normal.  The upper and lower  extremity sensation normal.  Upper and lower extremities back range of  motion is very limited.  He appears anxious when he tries to move and  goes to about he can touch the top of his patella bilaterally.  Extension is really 0-25% of normal.  Upper and lower extremity range of  motion is normal at the shoulders, elbows, wrists, fingers, hips, knees,  and ankles.  His gait shows no evidence of toe drag or knee instability.  He moves rather slowly.   IMPRESSION:  1. Lumbar post laminectomy syndrome.  His  level disability that out      way the underlying problem, I think he has some fear avoiding      patterns, which may be responsive to some aquatic therapy if he can      learn to move without fearing pain.  2. Vocational standpoint, would recommend vocational rehabilitation.      See if he could find a job that can allow him to sit and stand      alternating.  He states that he is not interested in that.  He has      already been told that he is permanently disabled and he like to      continue down that course.  3. In terms of medication management, informed him of clinic rules and      regulations, need for multi visits, need for urine drug screen       monitoring.  He states he just expected to come here to get      medications.  He is not sure whether he wants to follow up, also      noted that we need to get urine drug screen results back first      before we prescribe any medications today.  He was informed of this      prior to his visit.   OTHER RECOMMENDATIONS:  1. Reinstatement of Neurontin going up to 800 t.i.d.  2. He may benefit from S1 transforaminal injection, possibly facet      injections as well.   Failing this, he may be a candidate for a spinal cord stimulator.   Discussed the plan with the patient, he is going to think about whether  he wants to pursue along the lines of my recommendations.      Dale Farmer, M.D.  Electronically Signed     AEK/MedQ  D:  01/18/2009 16:55:27  T:  01/19/2009 05:31:06  Job #:  [id number]   cc:   Dale Farmer, M.D.  Fax: [phone]

## 2010-10-06 NOTE — Consult Note (Signed)
[place]. [facility]  Patient:    Dale Farmer, Dale Farmer                     MRN: [id number] Proc. Date: 03/23/00 Adm. Date:  [id number] Attending:  Farley Ly CC:         Farley Ly, M.D.  Clois Comber, M.D., [place], [place]  [place]. Daphine Deutscher, M.D.   Consultation Report  HISTORY OF PRESENT ILLNESS:  The patient is a 40 year old, admitted yesterday with nausea, vomiting, abdominal pain, and diarrhea.  According to the patient and to his mother, who is with him currently, he has had these same symptoms since 40 years of age.  He has had several evaluations failing to reveal any obvious cause dating back to childhood.  The symptoms come and go, at times he will go for long periods of time without any particular symptoms.  He has chronic loose stools.  He presented to the emergency room in [place] after hematemesis last Saturday six days ago.  He was given Aciphex.  His symptoms worsened.  He had abdominal x-rays and ultrasound, apparently had a normal gallbladder and dilated loops of small bowel.  I saw the films and it did look like either an ileus or early small-bowel obstruction; he had these done here. His family notes his belly has decreased in size and he had a CT scan here which I have not been able to locate but reportedly did not show a small-bowel obstruction.  He apparently had tarry stools and had hematemesis.  There was also a question of a fever six days ago.  His major symptoms, i.e., distended belly, fever, hematemesis were all six days ago.  Since then he has had heartburn, decreased appetite, abdominal discomfort, continued loose stools. He was unable to see a gastroenterologist in [place] for some time and came to the emergency room at [facility] and was admitted.  Pertinent data on admission reveals white count 8.4, hemoglobin of 15.8, MCV of 88.  Liver test, lipase, etc. were okay.  KUB repeated and apparently looked better.  He was seen by  Dr. Daphine Deutscher, who felt that he had gas in his colon and it was recommended that he have a CT with oral and rectal contrast. NG tube was attempted but they were unable to place it.  The patient continues to feel poorly.  MEDICATIONS ON ADMISSION:  No chronic medicines other than the Aciphex given to him in [place].  It is notably he had been taking Alka-Seltzer frequently for heartburn.  ALLERGIES:  He has no drug allergies.  PAST MEDICAL HISTORY:  He had a colonoscopy 10 years ago for bright red bleeding.  The results of this were unknown, but he was told there were no significant findings.  He has a history of a hernia as a child and has a history of reflux and a long history of stomach problems.  PAST SURGICAL HISTORY:  No previous surgeries.  FAMILY HISTORY:  His mother has irritable bowel reflux, coronary disease. Father is healthy.  He has colon cancer in a grandparent.  SOCIAL HISTORY:  He lives in [place] with his wife and daughter.  Works [employer] representative, has an [employer].  History of cigarettes and heavy alcohol but quit both.  PHYSICAL EXAMINATION:  VITAL SIGNS:  The patient is afebrile, vital signs are normal.  GENERAL:  Pleasant and somewhat obese white male in no acute distress.  HEENT:  Eyes:  Sclerae nonicteric.  NECK:  Supple.  No lymphadenopathy.  LUNGS:  Clear.  HEART:  Regular rate and rhythm without murmurs or gallops.  ABDOMEN:  Soft, essentially nontender except in the midepigastrium.  ASSESSMENT:  Hematemesis, abdominal pain, bloating, etiology uncertain.  I think chronicity of his symptoms will be to rule out Crohns disease.  With a negative computed tomographic scan I think endoscopy would be good first step.  PLAN:  We will try to schedule him for an endoscopy in the morning and follow this with a small-bowel series on Monday. DD:  03/23/00 TD:  03/25/00 Job: [id number]

## 2010-10-06 NOTE — Op Note (Signed)
NAME:  Dale Farmer, Dale Farmer NO.:  [id number]   MEDICAL RECORD NO.:  [id number]          PATIENT TYPE:  AMB   LOCATION:  SDS                          FACILITY:  [facility]   PHYSICIAN:  Cristi Loron, M.D.DATE OF BIRTH:  1971-02-02   DATE OF PROCEDURE:  06/03/2006  DATE OF DISCHARGE:                               OPERATIVE REPORT   PREOPERATIVE DIAGNOSES:  Left L5-S1 herniated nucleus pulposus, spinal  stenosis, degenerative disease, lumbar radiculopathy, lumbago.   POSTOPERATIVE DIAGNOSES:  Left L5-S1 herniated nucleus pulposus, spinal  stenosis, degenerative disease, lumbar radiculopathy, lumbago.   PROCEDURE:  Left L5-S1 microdiskectomy using microdissection.   SURGEON:  Cristi Loron, M.D.   ASSISTANT:  Hilda Lias, M.D.   ANESTHESIA:  General endotracheal.   ESTIMATED BLOOD LOSS:  Minimal.   SPECIMENS:  None.   DRAINS:  None.   COMPLICATIONS:  None.   BRIEF HISTORY:  The patient is a 35young white male, who has suffered  from chronic back pain.  More recently, he has developed severe left leg  pain consistent with left S1 radiculopathy.  He failed medical  management and was worked up with a lumbar MRI, which demonstrated a  central and left-sided herniated disk with compression of the left S1  nerve root.  I discussed the various treatment options with the patient,  including surgery.  The patient has weighed the risks, benefits and  alternatives of surgery and decided to proceed with a left L5-S1  microdiskectomy.   DESCRIPTION OF PROCEDURE:  The patient was brought to the operating room  by the anesthesia team.  General endotracheal anesthesia was induced.  The patient was then turned to the prone position on a Wilson frame and  his lumbosacral region was then prepared with Betadine Scrub and  Betadine Solution and sterile drapes were applied.  I then injected the  area to be incised with Marcaine with epinephrine solution and used a  scalpel to make a linear midline incision over the L5-S1 interspace.  I  used electrocautery to perform a subperiosteal dissection, exposing the  left spinous processes and laminae of L5 and the sacrum.  We obtained  intraoperative radiograph to confirm our location.   We then inserted the [facility] retractor for exposure, and then brought  the operative microscope into the field, and under its magnification and  illumination, completed the microdissection/decompression.  We used a  high-speed drill to perform a left L5 laminotomy.  We widened the  laminotomy with a Kerrison punch and removed the left L5-S1 ligamentum  flavum.  We then also removed the cephalad aspect of the left S1 lamina,  i.e., we performed a foraminotomy about the left S1 nerve root.  We then  used microdissection to free up the nerve root from the epidural tissue,  and then Dr. Jeral Fruit gently retracted the thecal sac and left S1 nerve  root medially with the D'Errico retractor.  This exposed the underlying  disk herniation, which had herniated from the L5-S1 disk space and  migrated caudally and was compressing the left S1 nerve root as it began  to enter  the neural foramen.  We removed this disk herniation in  multiple fragments using a polyp forceps.  I then inspected the L5-S1  intervertebral disk.  The disk was bulging centrally and quite  calcified, but there did not appear to be any impending herniations, and  there was a small hole in the annulus.  I, therefore, decided not to  enter into the intervertebral disk space.  I obtained hemostasis with  bipolar electrocautery.  I then palpated along the ventral surface of  the thecal sac and along the exit route of the left S1 nerve root and  noted that the neural structures were well decompressed.  We then  irrigated the wound out with Bacitracin solution, removed the McCullough  retractor, and then we reapproximated the patient's thoracolumbar fascia  with  interrupted #1 Vicryl suture, the subcutaneous tissue with  interrupted 2-0 Vicryl suture, and the skin with Steri-Strips and  benzoin.  The wound was then coated with Bacitracin ointment, a sterile  dressing was applied, the drapes were removed, and the patient was  subsequently returned to the supine position, where he was extubated by  the anesthesia team and transported to the postanesthesia care unit in  stable condition.  All sponge, instrument and needle counts were correct  at the end of this case.      Cristi Loron, M.D.  Electronically Signed     JDJ/MEDQ  D:  06/03/2006  T:  06/04/2006  Job:  [id number]

## 2010-10-06 NOTE — Discharge Summary (Signed)
[facility]  Patient:    Dale Farmer, Dale Farmer                     MRN: [id number] Adm. Date:  [id number] Disc. Date: [id number] Attending:  Farley Ly Dictator:   Tawni Millers CC:         Dr.DELORO _____, [address], [address]  [place], [place]  Tawni Millers, M.D.  [place] Daphine Deutscher, M.D.  James L. Randa Evens, M.D.   Discharge Summary  DISCHARGE DIAGNOSES: 1. Partial small-bowel obstruction on admission, which resolved.  Probable    ileus of unknown etiology.  No anatomical lesions found. 2. Recent hematemesis, probably secondary to gastritis found during this    admission. 3. Chronic diarrhea, workup negative. 4. Gastroesophageal reflux disease, presumed clinical diagnosis.  DISCHARGE MEDICATIONS:  Aciphex 20 mg 1 tablet q.d., #30 given with refill of 1.  PROCEDURES: 1. KUB on March 22, 2000, demonstrating dilated small bowel with fluid    levels consistent with distal small-bowel obstruction. 2. Abdominal and pelvic CT completed on March 23, 2000:  Fatty liver.  No    evidence of small-bowel obstruction.  No active inflammation present. 3. Abdominal two-way films completed on March 23, 2000, demonstrating that    the small-bowel obstruction had resolved. 4. EGD completed on March 24, 2000, showed multiple small gastric erosions    present in the body of the stomach. 5. Small-bowel follow-through on March 25, 2000, demonstrated only minimal    small-bowel distention.  No lesions consistent with Crohns or small-bowel    obstruction.  CONSULTATIONS: 1. Dr. Luretha Murphy of general surgery. 2. Dr. Carman Ching, gastroenterologist.  HISTORY OF PRESENT ILLNESS:  The patient is a 40 year old Caucasian male with significant history of reflux who presented today to the ED complaining of abdominal pain, nausea, vomiting, diarrhea that began on Saturday.  The patient states on Saturday evening he awoke feeling nauseated and,  shortly after, vomited about one pint of blood.  He continued to have nausea and vomiting without hematemesis and presented to [facility] ED on Monday for evaluation.  There, he was seen and discharged on Zantac, which did not relieve his symptoms.  He describes his emesis as ricey and foul-smelling.  He saw Dr. ______  on Wednesday, who scheduled him for ultrasound of the gallbladder and prescribed him Aciphex.  However, overnight his symptoms continued to worsen, and he presented to the [facility] ED early Friday morning. There, he was given Phenergan and received abdominal ultrasound and x-rays that showed dilated loops of small bowel and normal gallbladder.  He then came to [facility] after discharge from [facility].  His nonfeculent emesis has been several times a day.  He also has had tarry, ricey, watery stools frequently, he states about 10 times a day.  His positive for diffuse abdominal pain that is a 5/10 with lying, 8/10 with sitting. Review of systems positive for fever up to 102.9 on Saturday, decreased appetite, heartburn, belching, nausea, vomiting, abdominal pressure and tightness, diffuse abdominal swelling, melena and diarrhea.  He denies any chest pain, shortness of breath, anorexia.  Nothing seems to worsen or alleviate symptoms per patient.  PAST MEDICAL HISTORY: 1. Stomach problems since childhood.  Bright red blood per rectum and    hematemesis.  The patient had colonoscopy x 2 in 1989 and 1992, with    unknown findings. 2. GERD.  Patient on Zantac in distant past, but discontinued because of no  improvement in his symptoms.  The patient self-medicates with Alka-Seltzer    on a daily basis for symptom relief.  No workup in the past. 3. Question of hernia as a child.  PAST SURGICAL HISTORY: 1. Colonoscopy x 2 for bright red blood per rectum about 10 years ago. 2. Hospitalization x 2 for this bright red blood per rectum.  MEDICATIONS:  Alka-Seltzer  p.r.n., but usually on a daily basis.  ALLERGIES:  No known drug allergies.  SOCIAL HISTORY:  Lives in [place] with wife and one daughter.  Works in [employer].  Eighth-grade education.  History of cigarette use, about 1-1/2 to 1 pack per day for five years.  However, patient quit five years ago. History of marijuana use.  History of heavy alcohol use, but quit seven years ago.  Now, social alcohol with less than five drinks per week.  FAMILY HISTORY:  Mother alive with GERD, coronary artery disease; however, no history of MI.  Father alive and healthy.  Maternal grandfather deceased with stroke and hypertension.  Maternal grandmother deceased with diabetes, renal failure, lupus, stroke, CHF at the age of 26, and colon cancer.  REVIEW OF SYSTEMS:  GENERAL:  Positive for fever to 102.9, decreased appetite, and difficulty sleeping.  SKIN:  Negative for any rashes and bleeding.  HEENT: Positive for hearing loss x 2 months.  RESPIRATORY:  Positive for cough with emesis.  No sputum and no dyspnea.  CARDIOVASCULAR:  Negative for any chest pain and edema.  ABDOMEN:  Positive for heartburn for about 10 years. Positive for nausea and vomiting without ______ emesis this morning, watery and ricey diarrhea; abdominal pressure and tightness, abdominal distention, and melena.  GU:  The patient denies dysuria and urgency.  NEUROLOGIC:  No weakness, tingling numbness.  MUSCULOSKELETAL:  No joint swelling, erythema, and pain.  PHYSICAL EXAMINATION:  VITAL SIGNS:  Temperature 98.6, pulse 88, respirations 16, blood pressure 160/84.  GENERAL:  Obese male, well appearing.  Comfortable lying in bed.  HEENT:  Normocephalic, atraumatic.  Extraocular muscles intact.  Pupils  equally round and reactive to light.  Hearing grossly intact.  Oropharynx not erythematous, without exudate.  No oral lesions.  Good dentition.  SKIN:  No rashes or masses.  Scar on right wrist and palmar surface.  NECK:  No  lymphadenopathy.  Supple.  CARDIOVASCULAR:  Regular rate and rhythm with normal S1, S2.  Pulses 2+ bilaterally.  LUNGS:  Respirations clear to auscultation. without wheezes or rales.  Good air movement.  ABDOMEN:  Distended.  Positive high-pitched bowel sounds.  No increased resonance or dullness to percussion.  Liver about 8 cm.  No splenomegaly. Positive tenderness diffusely but increased in left lower quadrant.  No rebound.  No guarding.  RECTAL:  Patient refused.  EXTREMITIES:  No edema and no calf tenderness.  Dorsalis pedis and posterior tibial pulses 2+ bilaterally.  NEUROLOGIC:  Alert and oriented x 4.  Pleasant.  Responds appropriately to questions.  Cranial nerves II-XII grossly intact.  Sensory grossly intact in upper extremities and lower extremities.  Motor 5/5 bilaterally.  Reflexes 2+ at biceps, brachioradialis, and patella.  ADMISSION LABORATORY DATA:  White blood cell count 8.4, which was normal. Hemoglobin 15.8, hematocrit 43.4, and platelets 230.  Sodium 136, potassium 3.8, chloride 109, bicarbonate 22, BUN 13, creatinine 0.8, and glucose 91. Lipase 29.  Total protein 0.7, alkaline phosphatase 61, AST 44, ALT 91, ______ 7.0, albumin 4.0, calcium 8.4.  UA was negative for any glucose, protein, leukocyte esterase, nitrates,  and blood.  KUB demonstrated dilated loops of small bowel with fluid levels consistent with distal small-bowel obstruction.  HOSPITAL COURSE: #1 - The patient presented with nausea, vomiting, and abdominal pain.  KUB on admission was suggestive of partial small-bowel obstruction.  Surgery was consulted.  An NG tube was ordered to be placed, but the patient refused.  CT of the abdomen and pelvis was ordered, which was completed on hospital day #2. This was significant for fatty liver and did not demonstrate any evidence of small-bowel obstruction or active inflammation.  Follow-up abdominal films on hospital day #2 also revealed resolution  of small-bowel obstruction.  To further rule out any upper GI obstructive lesions the patient underwent small-bowel follow-through, which was negative for small-bowel obstruction or Crohns and positive only for minimal small-bowel distention.  Symptoms on admission were felt to be secondary to partial small-bowel obstruction which was attributed to ileus of unknown etiology.  By the day of discharge the patient was pain-free and tolerating a regular diet.  #2 - HEMATEMESIS:  Reported several days prior to admission, along with abdominal pain.  No episode of nausea or vomiting with this admission. However, EGD was ordered to rule out any active disease.  Results demonstrated multiple small erosive lesions which were most likely from the patients admitted use of aspirin and Alka-Seltzer.  The patient was sent home on a PPI and advised to avoid aspirin, alcohol, Alka-Seltzer, and cigarettes.  He is to seek medical attention if symptoms recur.  #3 - CHRONIC DIARRHEA:  The patient has a long history of GI problems that have existed since childhood.  Given extensive duration of symptoms, there was some concern for inflammatory bowel disease.  GI was consulted, and the patient underwent small-bowel follow-through for further evaluation.  Findings on EGD were negative for Crohns or small-bowel obstruction.  Several guaiacs of stools were also negative.  Given findings and resolution of symptoms, we felt inflammatory bowel disease was less likely.  Workup also included stool, ova, parasites, and white blood cells, which were all negative.  Given findings, we thought the process may possibly be related to questionable irritable bowel syndrome, and further workup such as colonoscopy was not necessary at this time.  At discharge the patient denied any diarrhea, constipation, or tenesmus.  The patient was informed that a lower GI workup might be warranted in the future if problems recurred.  The  patient was agreeable with this.  #4 - GASTROESOPHAGEAL REFLUX DISEASE:  On admission the patient was placed on a PPI.  His symptoms improved significantly on this regimen.  EGD was performed and showed multiple small erosions but no other lesions or masses suggestive of obstruction.  At discharge, the patient denied any heartburn or reflux.  He was given a prescription for a PPI to take at home.  He is to follow up with me in the internal medicine clinic.  OTHER LABORATORY DATA:  Hepatitis B surface antigen negative.  ______ antibody negative.  Hepatitis A antibody negative.  Hepatitis C antibody negative.  ______ antibody IgA negative.  Stool ova, parasites, and white blood cells negative.  DISCHARGE LABORATORY DATA:  At discharge the patients white blood cell count was stable at 6.6.  His hemoglobin and hematocrit were stable at 15.5 and 43.2 respectively.  His creatinine was 1.1 and BUN 11.  Cholesterol panel was drawn which showed a cholesterol of 153, triglycerides 149, HDL 119, and LDL 104.  FOLLOW-UP: 1. The patient is to follow up in  the internal medicine clinic on Friday,    April 05, 2000, at 3 p.m. with me. 2. He is to follow up with Dr. ______ at regularly scheduled appointment.  DISCHARGE INSTRUCTIONS:  The patient was advised to decrease intake of coffee, colas, and chocolate.  He is to avoid aspirin, ibuprofen, alcohol, and cigarettes.  The patient was also informed of his increased cholesterol and advised to start a low fat diet. DD:  03/30/00 TD:  03/31/00 Job: [id number]

## 2010-10-06 NOTE — Cardiovascular Report (Signed)
NAME:  Dale Farmer, Dale Farmer                        ACCOUNT NO.:  [id number]   MEDICAL RECORD NO.:  [id number]                   PATIENT TYPE:  OIB   LOCATION:  6501                                 FACILITY:  [facility]   PHYSICIAN:  Rollene Rotunda, M.D.                DATE OF BIRTH:  [birth year]   DATE OF PROCEDURE:  11/29/2003  DATE OF DISCHARGE:                              CARDIAC CATHETERIZATION   PRIMARY CARE PHYSICIAN:  Wyvonnia Lora.   PROCEDURE:  Left heart catheterization, coronary arteriography.   INDICATION:  Evaluate patient with chest pain and a Cardiolite suggesting  mild inferior ischemia.   PROCEDURE NOTE:  Left heart catheterization was performed via the right  femoral artery.  The artery was cannulated using a Smart needle.  It was  difficult vessel to locate as it was deep and lying somewhat underneath the  nerve.  A 4 French arterial sheath was inserted via the modified Seldinger  technique.  Preformed Judkins and a pigtail catheter were utilized.  We did  use a JL-3.5 to cannulate the left main.   RESULTS:   HEMODYNAMICS:  1. LV 130/26.  2. Aortic 131/85.   CORONARIES:  There was a short normal left main.  The LAD was normal and did  not wrap the apex.  There was a large first diagonal which was branching and  normal.  The circumflex had an ostial 25% stenosis and mild luminal  irregularities in the AV groove.  An OM-1 and OM-2 were moderate size and  normal.  The right coronary artery was a large dominant vessel.  The PDA was  large and normal.   LEFT VENTRICULOGRAM:  Left ventriculogram was obtained in the RAO  projection.  The EF was 65% with normal wall motion.   CONCLUSION:  1. Minimal coronary plaque.  2. Normal left ventricular function.   PLAN:  No further cardiovascular testing is suggested.  The patient will  continue with primary risk reduction.  The patient will follow with Dr.  Margo Common for evaluation of nonanginal chest pain.                               Rollene Rotunda, M.D.    JH/MEDQ  D:  11/29/2003  T:  11/29/2003  Job:  [id number]   cc:   Wyvonnia Lora  [address]  [place]  [place] [address]  Fax: [phone]

## 2011-02-27 LAB — BASIC METABOLIC PANEL
BUN: 13
BUN: 9
CO2: 25
Calcium: 9.2
Chloride: 107
Creatinine, Ser: 1.05
GFR calc Af Amer: >60
GFR calc non Af Amer: >60
Potassium: 4.1
Sodium: 139

## 2011-02-27 LAB — CBC
HCT: 45.1
Hemoglobin: 12.9 — ABNORMAL LOW
MCHC: 34.8
MCV: 91.5
MCV: 92.7
Platelets: 188
RDW: 13.1
WBC: 6.4
WBC: 8.4

## 2011-02-27 LAB — TYPE AND SCREEN: ABO/RH(D): A POS

## 2011-05-31 ENCOUNTER — Other Ambulatory Visit: Payer: Self-pay | Admitting: Neurosurgery

## 2011-05-31 DIAGNOSIS — M545 Low back pain: Secondary | ICD-10-CM

## 2011-06-08 ENCOUNTER — Other Ambulatory Visit: Payer: Self-pay

## 2011-06-08 ENCOUNTER — Inpatient Hospital Stay: Admission: RE | Admit: 2011-06-08 | Payer: Self-pay | Source: Ambulatory Visit

## 2011-06-13 ENCOUNTER — Ambulatory Visit
Admission: RE | Admit: 2011-06-13 | Discharge: 2011-06-13 | Disposition: A | Discharge status: Home / Self Care | Payer: Medicare Other | Source: Ambulatory Visit | Attending: Neurosurgery | Admitting: Neurosurgery

## 2011-06-13 DIAGNOSIS — M545 Low back pain: Secondary | ICD-10-CM

## 2011-06-13 MED ORDER — MEPERIDINE HCL 100 MG/ML IJ SOLN
75.0000 mg | Freq: Once | INTRAMUSCULAR | Status: AC
  Administered 2011-06-13: 75 mg via INTRAMUSCULAR

## 2011-06-13 MED ORDER — ONDANSETRON HCL 4 MG/2ML IJ SOLN
4.0000 mg | Freq: Once | INTRAMUSCULAR | Status: AC
  Administered 2011-06-13: 4 mg via INTRAMUSCULAR

## 2011-06-13 MED ORDER — IOHEXOL 180 MG/ML  SOLN
15.0000 mL | Freq: Once | INTRAMUSCULAR | Status: AC | PRN
  Administered 2011-06-13: 15 mL via INTRATHECAL

## 2011-06-13 NOTE — Progress Notes (Addendum)
Pt has been off imipramine for 2 days.  Discharge instructions explained, consent signed and valium given  1242 in post assessment the 11:52 am note was not for this pt.

## 2011-06-25 ENCOUNTER — Telehealth: Payer: Self-pay | Admitting: Radiology

## 2011-06-25 ENCOUNTER — Other Ambulatory Visit: Payer: Self-pay | Admitting: Neurosurgery

## 2011-06-25 DIAGNOSIS — R51 Headache: Secondary | ICD-10-CM

## 2011-06-26 ENCOUNTER — Ambulatory Visit
Admission: RE | Admit: 2011-06-26 | Discharge: 2011-06-26 | Disposition: A | Discharge status: Home / Self Care | Payer: Medicare Other | Source: Ambulatory Visit | Attending: Neurosurgery | Admitting: Neurosurgery

## 2011-06-26 DIAGNOSIS — M5126 Other intervertebral disc displacement, lumbar region: Secondary | ICD-10-CM

## 2011-06-26 DIAGNOSIS — R51 Headache: Secondary | ICD-10-CM

## 2011-06-26 MED ORDER — IOHEXOL 180 MG/ML  SOLN
1.0000 mL | Freq: Once | INTRAMUSCULAR | Status: AC | PRN
  Administered 2011-06-26: 1 mL via EPIDURAL

## 2011-06-26 NOTE — Progress Notes (Addendum)
Blood drawn for blood patch from right antecube, site unremarkable and pt tolerated procedure well, 20 cc of blood obtained.  1035 pt returned from procedure, headache is already better. Reviewed discharge instructions for when he goes home.dd

## 2012-11-26 ENCOUNTER — Other Ambulatory Visit (HOSPITAL_COMMUNITY): Payer: Self-pay

## 2012-11-26 DIAGNOSIS — G4730 Sleep apnea, unspecified: Secondary | ICD-10-CM

## 2012-12-04 ENCOUNTER — Ambulatory Visit: Payer: Medicare Other | Attending: Neurology | Admitting: Sleep Medicine

## 2012-12-04 VITALS — Ht 66.0 in | Wt 230.0 lb

## 2012-12-04 DIAGNOSIS — G4733 Obstructive sleep apnea (adult) (pediatric): Secondary | ICD-10-CM | POA: Insufficient documentation

## 2012-12-04 DIAGNOSIS — G4730 Sleep apnea, unspecified: Secondary | ICD-10-CM

## 2012-12-04 DIAGNOSIS — Z6837 Body mass index (BMI) 37.0-37.9, adult: Secondary | ICD-10-CM | POA: Insufficient documentation

## 2012-12-13 NOTE — Procedures (Signed)
[name] A. [name], MD     [email or web address]        NAME:  Dale Farmer, Dale Farmer              ACCOUNT NO.:  [id number]  MEDICAL RECORD NO.:  [id number]          PATIENT TYPE:  OUT  LOCATION:  SLEEP LAB                     FACILITY:  [facility]  PHYSICIAN:  [name] A. [name], M.D. DATE OF BIRTH:  April 29, 1971  DATE OF STUDY:  12/04/2012                           NOCTURNAL POLYSOMNOGRAM  REFERRING PHYSICIAN:  [name], M.D.  INDICATION:  A 42 year old man who has a history of obesity and obstructive sleep apnea syndrome.  This is a CPAP titration recording after losing 50 pounds.  MEDICATIONS:  Levothyroxine, atenolol, imipramine, Zoloft, Relafen, clonazepam, allopurinol, Lipitor, Lyrica, and Ultram.  EPWORTH SLEEPINESS SCALE:  10, BMI 37.  ARCHITECTURAL SUMMARY:  This is a full night titration recording.  The total recording time is 364 minutes, sleep efficiency 53%, sleep latency 29 minute.  REM latency 38 minutes.  Stage N1 9%, N2 48%, N3 0.3%, and REM sleep 42%.  RESPIRATORY SUMMARY:  Baseline oxygen saturation is 97, lowest saturation 92 during non REM sleep.  The patient was placed on positive pressure starting at 5 and titrated to a pressure of 9.  Optimal pressure is 9 with resolution of events and good tolerance.  LIMB MOVEMENT SUMMARY:  PLM index 0.  ELECTROCARDIOGRAM SUMMARY:  Average heart rate is 73 with no significant dysrhythmias observed.  IMPRESSION: 1. Obstructive sleep apnea syndrome, which responds well to a CPAP of     9. 2. 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  Early REM latency can be seen with REM rebound     phenomenon particularly withdrawal from stimulants and anti-     depressions.  It also can be seen in narcolepsy.    [name] A. [name], M.D.    KAD/MEDQ  D:  12/13/2012 11:49:20  T:  12/13/2012 12:02:25  Job:  [id number]

## 2015-06-14 ENCOUNTER — Ambulatory Visit: Payer: Medicare Other | Admitting: Neurology

## 2015-06-15 ENCOUNTER — Encounter: Payer: Self-pay | Admitting: Neurology

## 2015-11-22 ENCOUNTER — Other Ambulatory Visit: Payer: Self-pay | Admitting: Orthopaedic Surgery

## 2015-11-23 NOTE — Telephone Encounter (Signed)
Rx done. 

## 2016-06-04 ENCOUNTER — Telehealth: Payer: Self-pay | Admitting: Orthopaedic Surgery

## 2016-12-08 ENCOUNTER — Telehealth: Payer: Self-pay | Admitting: Orthopaedic Surgery

## 2017-06-10 ENCOUNTER — Other Ambulatory Visit: Payer: Self-pay | Admitting: Orthopaedic Surgery

## 2017-12-25 ENCOUNTER — Other Ambulatory Visit: Payer: Self-pay | Admitting: Orthopaedic Surgery

## 2018-07-07 ENCOUNTER — Other Ambulatory Visit: Payer: Self-pay | Admitting: Orthopaedic Surgery

## 2019-09-08 ENCOUNTER — Other Ambulatory Visit: Payer: Self-pay | Admitting: Orthopaedic Surgery

## 2020-01-11 ENCOUNTER — Ambulatory Visit (INDEPENDENT_AMBULATORY_CARE_PROVIDER_SITE_OTHER): Payer: Self-pay | Admitting: Gastroenterology

## 2020-10-15 ENCOUNTER — Telehealth: Payer: Self-pay | Admitting: Orthopaedic Surgery

## 2021-12-17 ENCOUNTER — Telehealth: Payer: Self-pay | Admitting: Orthopaedic Surgery

## 2022-03-28 ENCOUNTER — Telehealth: Payer: Self-pay | Admitting: Orthopaedic Surgery

## 2023-04-16 ENCOUNTER — Telehealth: Payer: Self-pay

## 2023-04-16 MED ORDER — ALLOPURINOL 300 MG PO TABS: 300.0000 mg | ORAL_TABLET | Freq: Every day | ORAL | 3 refills | Status: AC

## 2023-04-17 NOTE — Telephone Encounter (Signed)
Complete

## 2023-04-23 ENCOUNTER — Other Ambulatory Visit: Payer: Self-pay

## 2024-01-10 ENCOUNTER — Encounter: Payer: Self-pay | Admitting: Radiology

## 2024-03-23 ENCOUNTER — Encounter: Payer: Self-pay | Admitting: Radiology
